# Patient Record
Sex: Male | Born: 2005 | Race: White | Hispanic: No | Marital: Single | Smoking: Never smoker
Health system: Southern US, Community
[De-identification: ages and names within clinical notes are randomized; demographics above are authoritative.]

## PROBLEM LIST (undated history)

## (undated) HISTORY — PX: MOUTH SURGERY: SHX715

---
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
## 2024-07-17 ENCOUNTER — Emergency Department (HOSPITAL_COMMUNITY)
Admission: EM | Admit: 2024-07-17 | Discharge: 2024-07-17 | Disposition: A | Discharge status: Home / Self Care | Payer: Self-pay | Attending: Emergency Medicine | Admitting: Emergency Medicine

## 2024-07-17 ENCOUNTER — Encounter (HOSPITAL_COMMUNITY): Payer: Self-pay

## 2024-07-17 ENCOUNTER — Other Ambulatory Visit: Payer: Self-pay

## 2024-07-17 DIAGNOSIS — X500XXA Overexertion from strenuous movement or load, initial encounter: Secondary | ICD-10-CM | POA: Insufficient documentation

## 2024-07-17 DIAGNOSIS — S76212A Strain of adductor muscle, fascia and tendon of left thigh, initial encounter: Secondary | ICD-10-CM | POA: Insufficient documentation

## 2024-07-17 MED ORDER — IBUPROFEN 800 MG PO TABS
800.0000 mg | ORAL_TABLET | Freq: Three times a day (TID) | ORAL | 0 refills | Status: AC | PRN
Start: 2024-07-17 — End: ?

## 2024-07-17 NOTE — ED Triage Notes (Signed)
 Pt stated that while he was working on his car yesterday, his rleft testicle began to hurt. Pt stated that he thinks he has a ruptured hernia or the start of a hernia.

## 2024-07-17 NOTE — Discharge Instructions (Signed)
 No lifting over 10 pounds for 1 week.  Follow-up with the health department or Dr. Mavis in a couple weeks

## 2024-07-17 NOTE — ED Provider Notes (Signed)
 [facility] EMERGENCY DEPARTMENT AT [facility] Provider Note   CSN: [id number] Arrival date & time: 07/17/24  1657     Patient presents with: Groin Pain   Alexander Sanchez is a 18 y.o. male.   Patient complains of left inguinal pain.  He does a lot of lifting at work  The history is provided by the patient and medical records. No language interpreter was used.  Groin Pain This is a new problem. The current episode started 12 to 24 hours ago. The problem occurs constantly. The problem has not changed since onset.Pertinent negatives include no chest pain, no abdominal pain and no headaches. Nothing aggravates the symptoms. Nothing relieves the symptoms.       Prior to Admission medications   Medication Sig Start Date End Date Taking? Authorizing Provider  ibuprofen  (ADVIL ) 800 MG tablet Take 1 tablet (800 mg total) by mouth every 8 (eight) hours as needed for moderate pain (pain score 4-6). 07/17/24  Yes Suzette Pac, MD    Allergies: Patient has no known allergies.    Review of Systems  Constitutional:  Negative for appetite change and fatigue.  HENT:  Negative for congestion, ear discharge and sinus pressure.   Eyes:  Negative for discharge.  Respiratory:  Negative for cough.   Cardiovascular:  Negative for chest pain.  Gastrointestinal:  Negative for abdominal pain and diarrhea.  Genitourinary:  Negative for frequency and hematuria.       Left inguinal pain  Musculoskeletal:  Negative for back pain.  Skin:  Negative for rash.  Neurological:  Negative for seizures and headaches.  Psychiatric/Behavioral:  Negative for hallucinations.     Updated Vital Signs BP 127/70 (BP Location: Right Wrist)   Pulse 80   Temp 98.6 F (37 C)   Resp 16   SpO2 97%   Physical Exam Vitals and nursing note reviewed.  Constitutional:      Appearance: He is well-developed.  HENT:     Head: Normocephalic.     Nose: Nose normal.  Eyes:     General: No scleral icterus.     Conjunctiva/sclera: Conjunctivae normal.  Neck:     Thyroid: No thyromegaly.  Cardiovascular:     Rate and Rhythm: Normal rate and regular rhythm.     Heart sounds: No murmur heard.    No friction rub. No gallop.  Pulmonary:     Breath sounds: No stridor. No wheezing or rales.  Chest:     Chest wall: No tenderness.  Abdominal:     General: There is no distension.     Tenderness: There is no abdominal tenderness. There is no rebound.  Genitourinary:    Comments: Tender left inguinal area but no hernia felt Musculoskeletal:        General: Normal range of motion.     Cervical back: Neck supple.  Lymphadenopathy:     Cervical: No cervical adenopathy.  Skin:    Findings: No erythema or rash.  Neurological:     Mental Status: He is alert and oriented to person, place, and time.     Motor: No abnormal muscle tone.     Coordination: Coordination normal.  Psychiatric:        Behavior: Behavior normal.     (all labs ordered are listed, but only abnormal results are displayed) Labs Reviewed - No data to display  EKG: None  Radiology: No results found.   Procedures   Medications Ordered in the ED - No data to  display                                  Medical Decision Making Risk Prescription drug management.   Patient with a groin strain.  He is put on Motrin  and will follow-up with either his primary care doctor or general surgery     Final diagnoses:  Inguinal strain, left, initial encounter    ED Discharge Orders          Ordered    ibuprofen  (ADVIL ) 800 MG tablet  Every 8 hours PRN        07/17/24 1844               Suzette Pac, MD 07/19/24 1205
